# Patient Record
Sex: Female | Born: 2000 | Hispanic: No | Marital: Single
Health system: Southern US, Community
[De-identification: ages and names within clinical notes are randomized; demographics above are authoritative.]

---
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
## 2022-04-05 ENCOUNTER — Other Ambulatory Visit: Payer: Self-pay

## 2022-04-05 ENCOUNTER — Emergency Department (HOSPITAL_COMMUNITY): Payer: Self-pay

## 2022-04-05 ENCOUNTER — Encounter (HOSPITAL_COMMUNITY): Payer: Self-pay

## 2022-04-05 ENCOUNTER — Emergency Department (HOSPITAL_COMMUNITY)
Admission: EM | Admit: 2022-04-05 | Discharge: 2022-04-05 | Disposition: A | Discharge status: Home / Self Care | Payer: Self-pay | Attending: Student | Admitting: Student

## 2022-04-05 DIAGNOSIS — W230XXA Caught, crushed, jammed, or pinched between moving objects, initial encounter: Secondary | ICD-10-CM | POA: Insufficient documentation

## 2022-04-05 DIAGNOSIS — S6991XA Unspecified injury of right wrist, hand and finger(s), initial encounter: Secondary | ICD-10-CM

## 2022-04-05 DIAGNOSIS — Z23 Encounter for immunization: Secondary | ICD-10-CM | POA: Insufficient documentation

## 2022-04-05 DIAGNOSIS — S65401A Unspecified injury of blood vessel of right thumb, initial encounter: Secondary | ICD-10-CM | POA: Insufficient documentation

## 2022-04-05 MED ORDER — IBUPROFEN 400 MG PO TABS
400.0000 mg | ORAL_TABLET | Freq: Once | ORAL | Status: AC
  Administered 2022-04-05: 400 mg via ORAL
  Filled 2022-04-05: qty 1

## 2022-04-05 MED ORDER — CEPHALEXIN 500 MG PO CAPS: 500.0000 mg | ORAL_CAPSULE | Freq: Four times a day (QID) | ORAL | 0 refills | Status: AC

## 2022-04-05 MED ORDER — LIDOCAINE HCL (PF) 1 % IJ SOLN
INTRAMUSCULAR | Status: AC
  Filled 2022-04-05: qty 30

## 2022-04-05 MED ORDER — TETANUS-DIPHTH-ACELL PERTUSSIS 5-2.5-18.5 LF-MCG/0.5 IM SUSY
0.5000 mL | PREFILLED_SYRINGE | Freq: Once | INTRAMUSCULAR | Status: AC
  Administered 2022-04-05: 0.5 mL via INTRAMUSCULAR
  Filled 2022-04-05: qty 0.5

## 2022-04-05 NOTE — Discharge Instructions (Signed)
I was able to fix your nail, please keep wrapping around it once a day for the next week, started you on antibiotics please take as prescribed.  It will take up to 4 to 6 weeks for your for a new nail to grow in place of the old nail.  Please follow-up with hand as needed.  Come back to the emergency department if you develop chest pain, shortness of breath, severe abdominal pain, uncontrolled nausea, vomiting, diarrhea.

## 2022-04-05 NOTE — ED Provider Triage Note (Signed)
Emergency Medicine Provider Triage Evaluation Note  Sharon Brandt , a 21 y.o. female  was evaluated in triage.  Pt complains of injury to her right thumb, states that she slammed her finger in a door, with her thumb, pain is just in her right thumb, she is able to move in all joints, unclear if she is up-to-date on her tetanus shot, she is not immunocompromise has no other complaints.  Review of Systems  Positive: Thumb pain, bleeding Negative: Paresthesias, weakness  Physical Exam  BP 120/89 (BP Location: Left Arm)   Pulse 78   Temp 97.8 F (36.6 C)   Resp 14   Ht 5\' 8"  (1.727 m)   Wt 61.2 kg   LMP 03/29/2022 (Approximate)   SpO2 100%   BMI 20.53 kg/m  Gen:   Awake, no distress   Resp:  Normal effort  MSK:   Moves extremities without difficulty  Other:    Medical Decision Making  Medically screening exam initiated at 12:50 AM.  Appropriate orders placed.  Sharon Brandt was informed that the remainder of the evaluation will be completed by another provider, this initial triage assessment does not replace that evaluation, and the importance of remaining in the ED until their evaluation is complete.  Imaging and tetanus shot have been ordered will need further work-up.   Sharon Russel, PA-C 04/05/22 [phone]

## 2022-04-05 NOTE — ED Provider Notes (Addendum)
[facility] EMERGENCY DEPARTMENT Provider Note   CSN: [id number] Arrival date & time: 04/05/22  0029     History  Chief Complaint  Patient presents with   Finger Injury    Sharon Brandt is a 21 y.o. female.  HPI  Without significant medical history resents with complaints of right thumb pain, states that she slammed her finger in a door, states that she has pain in the end of her thumb but still able to movement, she denies any paresthesia or weakness, not immunocompromise, up-to-date today on Tdap, she has not had a thing for pain she has no other complaints.  Home Medications Prior to Admission medications   Medication Sig Start Date End Date Taking? Authorizing Provider  cephALEXin (KEFLEX) 500 MG capsule Take 1 capsule (500 mg total) by mouth 4 (four) times daily for 7 days. 04/05/22 04/12/22 Yes Carroll Sage, PA-C  acetaminophen (TYLENOL) 160 MG/5ML solution Take 320 mg by mouth every 4 (four) hours as needed for fever or pain.    [provider]  HYDROcodone-acetaminophen (LORTAB) 7.5-500 MG/15ML solution Take 5-7.5 mLs by mouth every 6 (six) hours as needed for pain. 12/16/12   Linwood Dibbles, MD      Allergies    Patient has no known allergies.    Review of Systems   Review of Systems  Constitutional:  Negative for chills and fever.  Respiratory:  Negative for shortness of breath.   Cardiovascular:  Negative for chest pain.  Gastrointestinal:  Negative for abdominal pain.  Neurological:  Negative for headaches.    Physical Exam Updated Vital Signs BP 108/69   Pulse 88   Temp 97.8 F (36.6 C)   Resp 18   Ht 5\' 8"  (1.727 m)   Wt 61.2 kg   LMP 03/29/2022 (Approximate)   SpO2 99%   BMI 20.53 kg/m  Physical Exam Vitals and nursing note reviewed.  Constitutional:      General: She is not in acute distress.    Appearance: She is not ill-appearing.  HENT:     Head: Normocephalic and atraumatic.     Nose: No congestion.  Eyes:      Conjunctiva/sclera: Conjunctivae normal.  Cardiovascular:     Rate and Rhythm: Normal rate and regular rhythm.     Pulses: Normal pulses.  Musculoskeletal:     Comments: Focused exam of right hand was performed, she has partially dislodged nail plate on the right thumb,  about half dislodged at the proximal ulnar aspect of the nail pain and the nail plate itself was completely lifted off the nail bed itself.  There is no lacerations on the nailbed itself, some slight abrasions present hemodynamically stable.  She has full range of motion all joints of her thumb neurovascular fully intact.  Skin:    General: Skin is warm and dry.  Neurological:     Mental Status: She is alert.  Psychiatric:        Mood and Affect: Mood normal.     ED Results / Procedures / Treatments   Labs (all labs ordered are listed, but only abnormal results are displayed) Labs Reviewed - No data to display  EKG None  Radiology DG Finger Thumb Right  Result Date: 04/05/2022 CLINICAL DATA:  Thumb injury, nail injury EXAM: RIGHT THUMB 2+V COMPARISON:  None Available. FINDINGS: There is no evidence of fracture or dislocation. There is no evidence of arthropathy or other focal bone abnormality. Soft tissues are unremarkable. IMPRESSION: Negative.  Electronically Signed   By: Charlett Nose M.D.   On: 04/05/2022 01:19    Procedures .Nail Removal  Date/Time: 04/05/2022 4:56 AM  Performed by: Carroll Sage, PA-C Authorized by: Carroll Sage, PA-C   Consent:    Consent obtained:  Verbal   Consent given by:  Patient   Risks, benefits, and alternatives were discussed: yes     Risks discussed:  Bleeding, incomplete removal, infection, permanent nail deformity and pain   Alternatives discussed:  No treatment, delayed treatment, alternative treatment, observation and referral Universal protocol:    Patient identity confirmed:  Verbally with patient Location:    Hand:  R thumb Pre-procedure details:     Skin preparation:  Alcohol   Preparation: Patient was prepped and draped in the usual sterile fashion   Anesthesia:    Anesthesia method:  Nerve block   Block needle gauge:  24 G   Block anesthetic:  Lidocaine 1% w/o epi   Block injection procedure:  Anatomic landmarks identified   Block outcome:  Anesthesia achieved Nail Removal:    Nail removed:  Complete   Nail bed repaired: no     Removed nail replaced and anchored: yes   Trephination:    Subungual hematoma drained: no   Ingrown nail:    Wedge excision of skin: no   Post-procedure details:    Dressing:  Gauze roll   Procedure completion:  Tolerated well, no immediate complications     Medications Ordered in ED Medications  Tdap (BOOSTRIX) injection 0.5 mL (0.5 mLs Intramuscular Given 04/05/22 0323)  ibuprofen (ADVIL) tablet 400 mg (400 mg Oral Given 04/05/22 0323)  lidocaine (PF) (XYLOCAINE) 1 % injection (  Given by Other 04/05/22 6553)    ED Course/ Medical Decision Making/ A&P                           Medical Decision Making Amount and/or Complexity of Data Reviewed Radiology: ordered.  Risk Prescription drug management.   This patient presents to the ED for concern of right thumb pain, this involves an extensive number of treatment options, and is a complaint that carries with it a high risk of complications and morbidity.  The differential diagnosis includes fracture, dislocation, compartment syndrome    Additional history obtained:  Additional history obtained from N/A External records from outside source obtained and reviewed including immunization records   Co morbidities that complicate the patient evaluation  N/A  Social Determinants of Health:  N/A    Lab Tests:  I Ordered, and personally interpreted labs.  The pertinent results include: N/A   Imaging Studies ordered:  I ordered imaging studies including DG of right thumb I independently visualized and interpreted imaging which showed  negative acute findings I agree with the radiologist interpretation   Cardiac Monitoring:  The patient was maintained on a cardiac monitor.  I personally viewed and interpreted the cardiac monitored which showed an underlying rhythm of: N/A   Medicines ordered and prescription drug management:  I ordered medication including lidocaine I have reviewed the patients home medicines and have made adjustments as needed  Critical Interventions:  N/A   Reevaluation:  Presents with right thumb pain, will obtain imaging update tetanus shot and reassess  Imaging is negative, I recommend digital block to remove the nail plate itself re attached onto the nailbed using Dermabond.  Patient was in agreement with this plan.  She did have long acrylic nails and  I was unable to separate the nail plate from the acrylic nail so I cut the acrylic nail to the length of the actual nail plate.  Patient tolerated well Consultations Obtained:  N/A     Test Considered:  N/A    Rule out Low suspicion for fracture or dislocation as x-ray does not reveal any acute findings.  Low suspicion for compartment syndrome as area was palpated it was soft to the touch, neurovascular fully intact before and after the procedure     Dispostion and problem list  After consideration of the diagnostic results and the patients response to treatment, I feel that the patent would benefit from discharge.  Thumb injury-I was able to reenter the right thumb, will start on antibiotics as she had abrasion along the nailbed itself, will have her follow-up with hand for further observation and strict return precautions.            Final Clinical Impression(s) / ED Diagnoses Final diagnoses:  Injury of finger of right hand, initial encounter    Rx / DC Orders ED Discharge Orders          Ordered    cephALEXin (KEFLEX) 500 MG capsule  4 times daily        04/05/22 0500              Carroll Sage, PA-C 04/05/22 0505    Carroll Sage, PA-C 04/05/22 8185    Sabas Sous, MD 04/05/22 [phone]

## 2022-04-05 NOTE — ED Triage Notes (Signed)
Pt reports she slammed her right thumb in a door tonight around 2230 and her fingernail is almost ripped off. She has an acrylic nail on that nail

## 2023-01-05 ENCOUNTER — Emergency Department (HOSPITAL_COMMUNITY)
Admission: EM | Admit: 2023-01-05 | Discharge: 2023-01-05 | Disposition: A | Discharge status: Home / Self Care | Payer: Self-pay | Attending: Emergency Medicine | Admitting: Emergency Medicine

## 2023-01-05 DIAGNOSIS — S09302A Unspecified injury of left middle and inner ear, initial encounter: Secondary | ICD-10-CM

## 2023-01-05 DIAGNOSIS — H7202 Central perforation of tympanic membrane, left ear: Secondary | ICD-10-CM | POA: Insufficient documentation

## 2023-01-05 MED ORDER — CIPROFLOXACIN-DEXAMETHASONE 0.3-0.1 % OT SUSP: 4.0000 [drp] | Freq: Two times a day (BID) | OTIC | 0 refills | Status: AC
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
